# Patient Record
Sex: Male | Born: 2000 | Race: Black or African American | Hispanic: No | Marital: Single | Smoking: Never smoker
Health system: Southern US, Community
[De-identification: ages and names within clinical notes are randomized; demographics above are authoritative.]

---
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
## 2020-03-30 ENCOUNTER — Encounter (HOSPITAL_BASED_OUTPATIENT_CLINIC_OR_DEPARTMENT_OTHER): Payer: Self-pay | Admitting: Emergency Medicine

## 2020-03-30 ENCOUNTER — Emergency Department (HOSPITAL_BASED_OUTPATIENT_CLINIC_OR_DEPARTMENT_OTHER)
Admission: EM | Admit: 2020-03-30 | Discharge: 2020-03-30 | Disposition: A | Discharge status: Home / Self Care | Payer: Medicaid Other | Attending: Emergency Medicine | Admitting: Emergency Medicine

## 2020-03-30 ENCOUNTER — Other Ambulatory Visit: Payer: Self-pay

## 2020-03-30 DIAGNOSIS — R2232 Localized swelling, mass and lump, left upper limb: Secondary | ICD-10-CM | POA: Diagnosis present

## 2020-03-30 DIAGNOSIS — L03114 Cellulitis of left upper limb: Secondary | ICD-10-CM | POA: Diagnosis not present

## 2020-03-30 DIAGNOSIS — L02419 Cutaneous abscess of limb, unspecified: Secondary | ICD-10-CM

## 2020-03-30 DIAGNOSIS — J4550 Severe persistent asthma, uncomplicated: Secondary | ICD-10-CM | POA: Insufficient documentation

## 2020-03-30 DIAGNOSIS — L03119 Cellulitis of unspecified part of limb: Secondary | ICD-10-CM

## 2020-03-30 MED ORDER — CLINDAMYCIN HCL 300 MG PO CAPS: 300.0000 mg | ORAL_CAPSULE | Freq: Four times a day (QID) | ORAL | 0 refills | Status: AC

## 2020-03-30 MED ORDER — CLINDAMYCIN HCL 150 MG PO CAPS
300.0000 mg | ORAL_CAPSULE | Freq: Once | ORAL | Status: AC
  Administered 2020-03-30: 300 mg via ORAL
  Filled 2020-03-30: qty 2

## 2020-03-30 MED ORDER — LIDOCAINE HCL (PF) 1 % IJ SOLN
5.0000 mL | Freq: Once | INTRAMUSCULAR | Status: AC
  Administered 2020-03-30: 5 mL via INTRADERMAL
  Filled 2020-03-30: qty 5

## 2020-03-30 MED ORDER — IBUPROFEN 800 MG PO TABS
800.0000 mg | ORAL_TABLET | Freq: Once | ORAL | Status: AC
  Administered 2020-03-30: 800 mg via ORAL
  Filled 2020-03-30: qty 1

## 2020-03-30 MED ORDER — PROBIOTIC 1-250 BILLION-MG PO CAPS: 1.0000 | ORAL_CAPSULE | Freq: Every day | ORAL | 0 refills | Status: AC

## 2020-03-30 NOTE — Discharge Instructions (Signed)
You may alternate Tylenol 1000 mg every 6 hours as needed for pain, fever and Ibuprofen 800 mg every 8 hours as needed for pain, fever.  Please take Ibuprofen with food.  Do not take more than 4000 mg of Tylenol (acetaminophen) in a 24 hour period.  Please take your antibiotics until complete.  I recommend that you apply a warm compress like a warm washcloth to this area 3-4 times a day for 10 to 15 minutes at a time to encourage drainage.  You also may run your elbow under warm water while in the shower or in a bathtub.  I recommend that you apply Neosporin over-the-counter and a dry bandage daily.  Please follow-up with your doctor in 1 week for wound recheck.  If you notice that the area of redness is spreading outside of the lines that we marked today, you have fever of 100.4 or higher, vomiting cannot stop, are unable to move your arm due to pain, please return to the emergency department.

## 2020-03-30 NOTE — ED Triage Notes (Signed)
C/o pain and swelling to left elbow with pustules.

## 2020-03-30 NOTE — ED Provider Notes (Signed)
TIME SEEN: 2:47 AM  CHIEF COMPLAINT: Left arm swelling, [name], swelling, [name], vomiting.  Not a diabetic. No h/i IVDA. Patient is left-hand dominant.  ROS: See HPI Constitutional: no fever  Eyes: no drainage  ENT: no runny nose   Cardiovascular:  no chest pain  Resp: no SOB  GI: no vomiting GU: no dysuria Integumentary: no rash  Allergy: no hives  Musculoskeletal: no leg swelling  Neurological: no slurred speech ROS otherwise negative  PAST MEDICAL HISTORY/PAST SURGICAL HISTORY:  Past Medical History:  Diagnosis Date  . Asthma    SEVERE    MEDICATIONS:  Prior to Admission medications   Medication Sig Start Date End Date Taking? Authorizing Provider  acetaminophen (TYLENOL) 500 MG tablet Take 1 tablet (500 mg total) by mouth every 6 (six) hours as needed. 08/31/14   Piepenbrink, [name], [name];VENTOLIN HFA) 108 (90 Base) MCG/ACT inhaler Inhale 2 puffs into the lungs every 6 (six) hours as needed for wheezing. 07/07/16   Antony Madura, PA-C  albuterol (PROVENTIL) (2.5 MG/3ML) 0.083% nebulizer solution Take 3 mLs (2.5 mg total) by nebulization every 6 (six) hours as needed for wheezing or shortness of breath. 07/07/16   Antony Madura, PA-C  Alum & Mag Hydroxide-Simeth (MAGIC MOUTHWASH) SOLN Take 5 mLs by mouth 3 (three) times daily as needed for mouth pain. 09/02/14   Piepenbrink, [name], PA-C  cefdinir (OMNICEF) 300 MG capsule Take 1 capsule (300 mg total) by mouth 2 (two) times daily. 05/05/15   Linna Hoff, MD  diphenhydrAMINE (BENADRYL) 25 MG tablet Take 1-2 tablets (25-50 mg total) by mouth every 8 (eight) hours as needed. 08/31/15   Sherrilee Gilles, NP  doxycycline (VIBRAMYCIN) 100 MG  capsule Take 1 capsule (100 mg total) by mouth 2 (two) times daily. One po bid x 7 days 01/22/17   Zadie Rhine, MD  hydrocortisone 2.5 % cream Apply topically 3 (three) times daily. 05/03/16   Lowanda Foster, NP  ibuprofen (ADVIL,MOTRIN) 600 MG tablet Take 600 mg by mouth every 6 (six) hours as needed (pain).    [provider]  ibuprofen (ADVIL,MOTRIN) 600 MG tablet Take 1 tablet (600 mg total) by mouth every 6 (six) hours as needed. 08/31/14   Piepenbrink, [name], PA-C  levalbuterol (XOPENEX) 1.25 MG/0.5ML nebulizer solution Take 1 ampule by nebulization every 4 (four) hours as needed for wheezing or shortness of breath.     [provider]  loratadine (CLARITIN) 10 MG tablet Take 1 tablet (10 mg total) by mouth daily. 07/07/16   Antony Madura, [name], then 20mg  by mouth daily for 3 days, then 10mg  daily for 3 days 07/07/16   , [name]    "burning from inside out"    SOCIAL HISTORY:  Social History   Tobacco Use  . Smoking status: Never Smoker  . Smokeless tobacco: Never Used  Substance Use Topics  . Alcohol use: No  FAMILY HISTORY: No family history on file.  EXAM: BP (!) 158/88   Pulse 97   Temp 98.2 F (36.8 C) (Oral)   Resp 16   Ht 6\' 4"  (1.93 m)   Wt 106.6 kg   SpO2 100%   BMI 28.61 kg/m  CONSTITUTIONAL: Alert and oriented and responds appropriately to questions. Well-appearing; well-nourished HEAD: Normocephalic EYES: Conjunctivae clear, pupils appear equal, EOM appear intact ENT: normal nose; moist mucous membranes NECK: Normal range of motion CARD: RRR; S1 and S2 appreciated; no murmurs, no clicks, no rubs, no gallops RESP: Normal chest excursion without splinting or  tachypnea; breath sounds clear and equal bilaterally; no wheezes, no rhonchi, no rales, no hypoxia or respiratory distress, speaking full sentences ABD/GI: Nondistended BACK:  The back appears normal EXT: Normal ROM in all joints; no deformity noted; patient has redness, warmth and swelling noted to the left elbow with full range of motion in this joint with no signs of inflamed bursa or joint effusion, there are 2 small yellow pustule to this area without drainage and a very minimal amount of fluctuance with surrounding induration; compartments of the left upper extremity are soft SKIN: Normal color for age and race; warm; no rash on exposed skin NEURO: Moves all extremities equally PSYCH: The patient's mood and manner are appropriate.   MEDICAL DECISION MAKING: Patient here with areas of cellulitis and small abscess to the left upper extremity.  No signs of bursitis, septic arthritis, compartment syndrome.  Abscess appears to be superficial to the bursa.  No systemic symptoms.  Will perform incision and drainage and start on clindamycin.  ED PROGRESS: Patient tolerated incision and drainage with moderate pus coming from the wound.  Wound culture sent.  Discussed supportive care instructions, return precautions.  Will discharge with clindamycin.  At this time, I do not feel there is any life-threatening condition present. I have reviewed, interpreted and discussed all results (EKG, imaging, lab, urine as appropriate) and exam findings with patient/family. I have reviewed nursing notes and appropriate previous records.  I feel the patient is safe to be discharged home without further emergent workup and can continue workup as an outpatient as needed. Discussed usual and customary return precautions. Patient/family verbalize understanding and are comfortable with this plan.  Outpatient follow-up has been provided as needed. All questions have been answered.   INCISION AND DRAINAGE Performed by:  Lynnex Fulp Consent: Verbal consent obtained. Risks and benefits: risks, benefits and alternatives were discussed Type: abscess  Body area: left elbow  Anesthesia: local infiltration  Incision was made with a scalpel.  Local anesthetic: lidocaine 1% without epinephrine  Anesthetic total: 3 ml  Complexity: complex Blunt dissection to break up loculations  Drainage: purulent  Drainage amount: moderate  Packing material: none  Patient tolerance: Patient tolerated the procedure well with no immediate complications.       Lorenza Marlette was evaluated in Emergency Department on 03/30/2020 for the symptoms described in the history of present illness. He was evaluated in the context of the global COVID-19 pandemic, which necessitated consideration that the patient might be at risk for infection with the SARS-CoV-2 virus that causes COVID-19. Institutional protocols and algorithms that pertain to the evaluation of patients at risk for COVID-19 are in a state of rapid change based on information released by regulatory bodies including the CDC and federal and state organizations. These policies and algorithms were followed during the patient's care in the ED.      Carrye Goller, 04/01/2020,  DO 03/30/20 0315

## 2020-04-01 LAB — AEROBIC CULTURE W GRAM STAIN (SUPERFICIAL SPECIMEN)

## 2020-04-02 ENCOUNTER — Telehealth: Payer: Self-pay | Admitting: *Deleted

## 2020-04-02 NOTE — Telephone Encounter (Signed)
Post ED Visit - Positive Culture Follow-up  Culture report reviewed by antimicrobial stewardship pharmacist: [facility] []  , Pharm.D. []  Enzo Bi, Pharm.D., BCPS AQ-ID []  , Pharm.D., BCPS []  Celedonio Miyamoto, Pharm.D., BCPS []  [place], Garvin Fila.D., BCPS, AAHIVP []  , Pharm.D., BCPS, AAHIVP []  Georgina Pillion, PharmD, BCPS []  , PharmD, BCPS []  [place], PharmD, BCPS []  [address], PharmD []  , PharmD, BCPS []  Estella Husk, PharmD  Pharmacy Team []  Lysle Pearl, PharmD []  , PharmD []  Phillips Climes, PharmD []  , Rph []  Agapito Games) , PharmD []  Verlan Friends, PharmD []  , PharmD []  Mervyn Gay, PharmD []  , PharmD []  Vinnie Level, PharmD []  [facility], PharmD []  , PharmD []  Len Childs, PharmD   Positive wound culture Treated with Clindamycin, organism sensitive to the same and no further patient follow-up is required at this time.  [facility] 04/02/2020, 5:40 PM
